# Patient Record
Sex: Male | Born: 1985 | Race: Asian | Hispanic: No | Marital: Single | Smoking: Never smoker
Health system: Southern US, Community
[De-identification: ages and names within clinical notes are randomized; demographics above are authoritative.]

## PROBLEM LIST (undated history)

## (undated) DIAGNOSIS — R6250 Unspecified lack of expected normal physiological development in childhood: Secondary | ICD-10-CM

---

## 2008-11-09 ENCOUNTER — Ambulatory Visit: Payer: Self-pay | Admitting: Internal Medicine

## 2010-12-30 ENCOUNTER — Ambulatory Visit: Payer: Self-pay | Admitting: Internal Medicine

## 2011-03-09 IMAGING — CR RIGHT LITTLE FINGER 2+V
1 series · 3 of 3 positions shown · non-contrast
Comparison: none

REASON FOR EXAM: injured during football; unable to extend distal phalanx
COMMENTS:

[Series 1: view not recorded · 0.17mm/px · 3 of 3 slices shown]
[im 1/3]
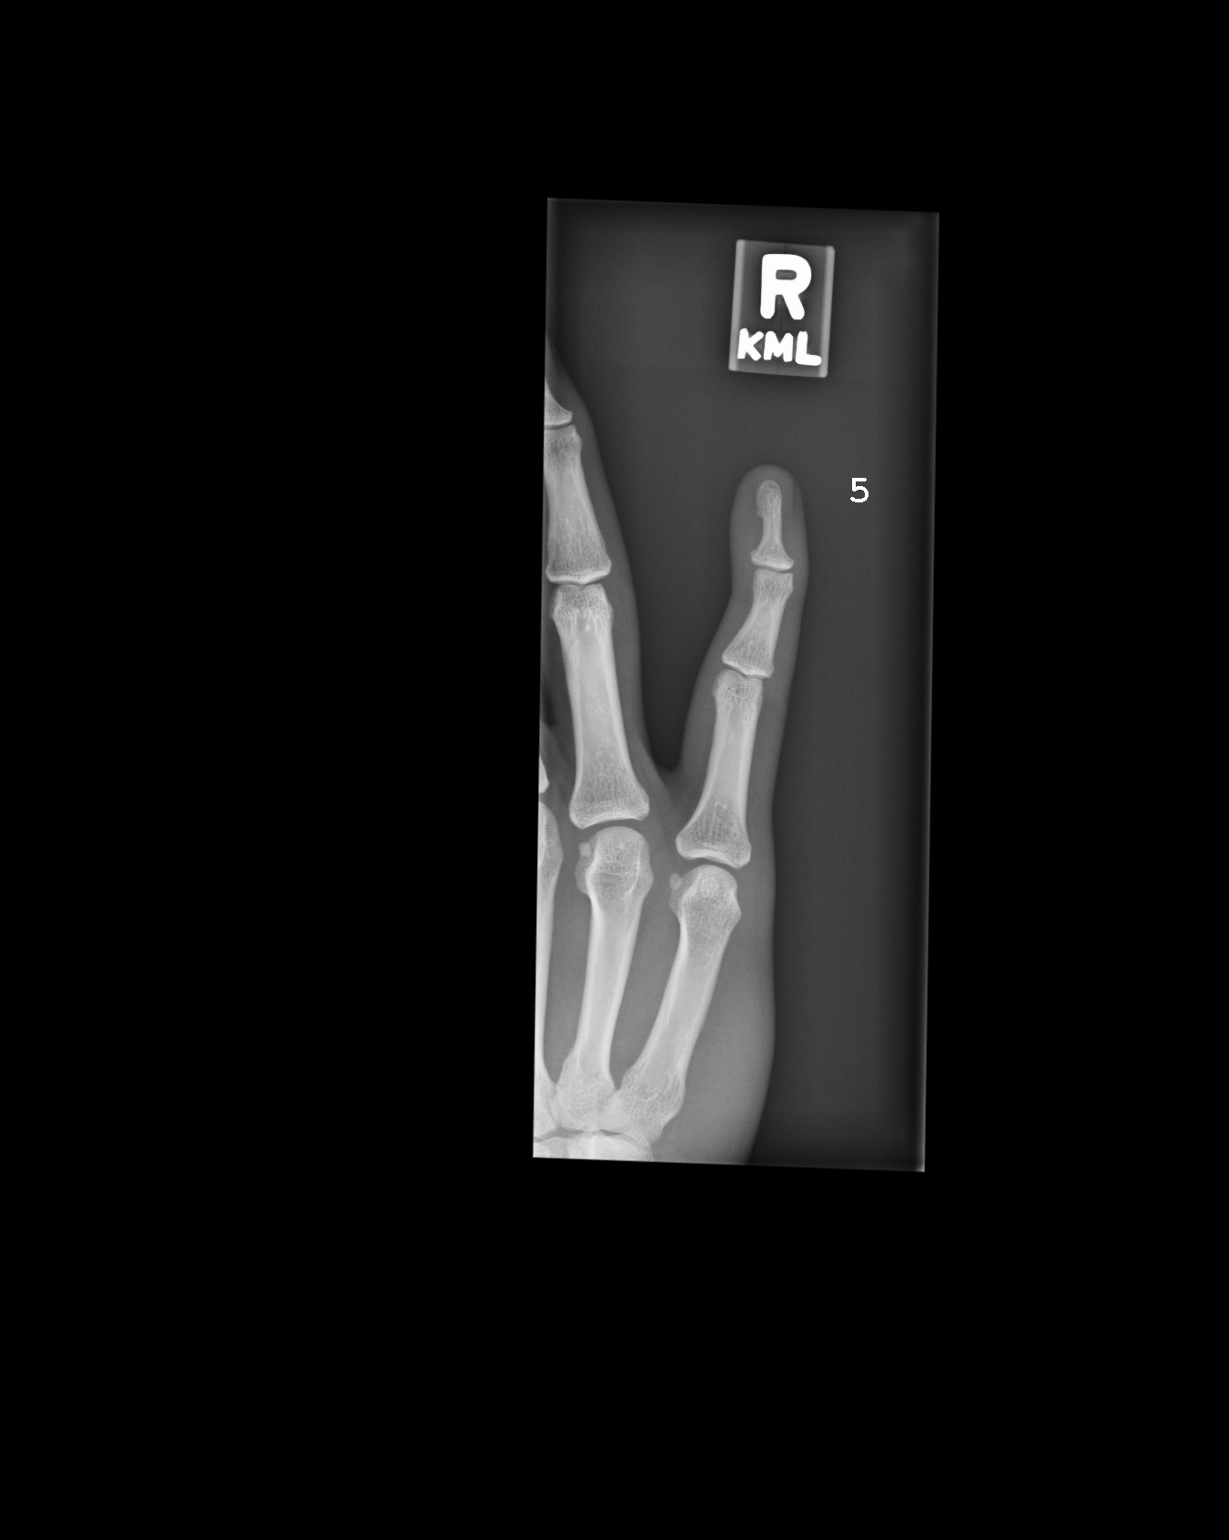
[im 2/3]
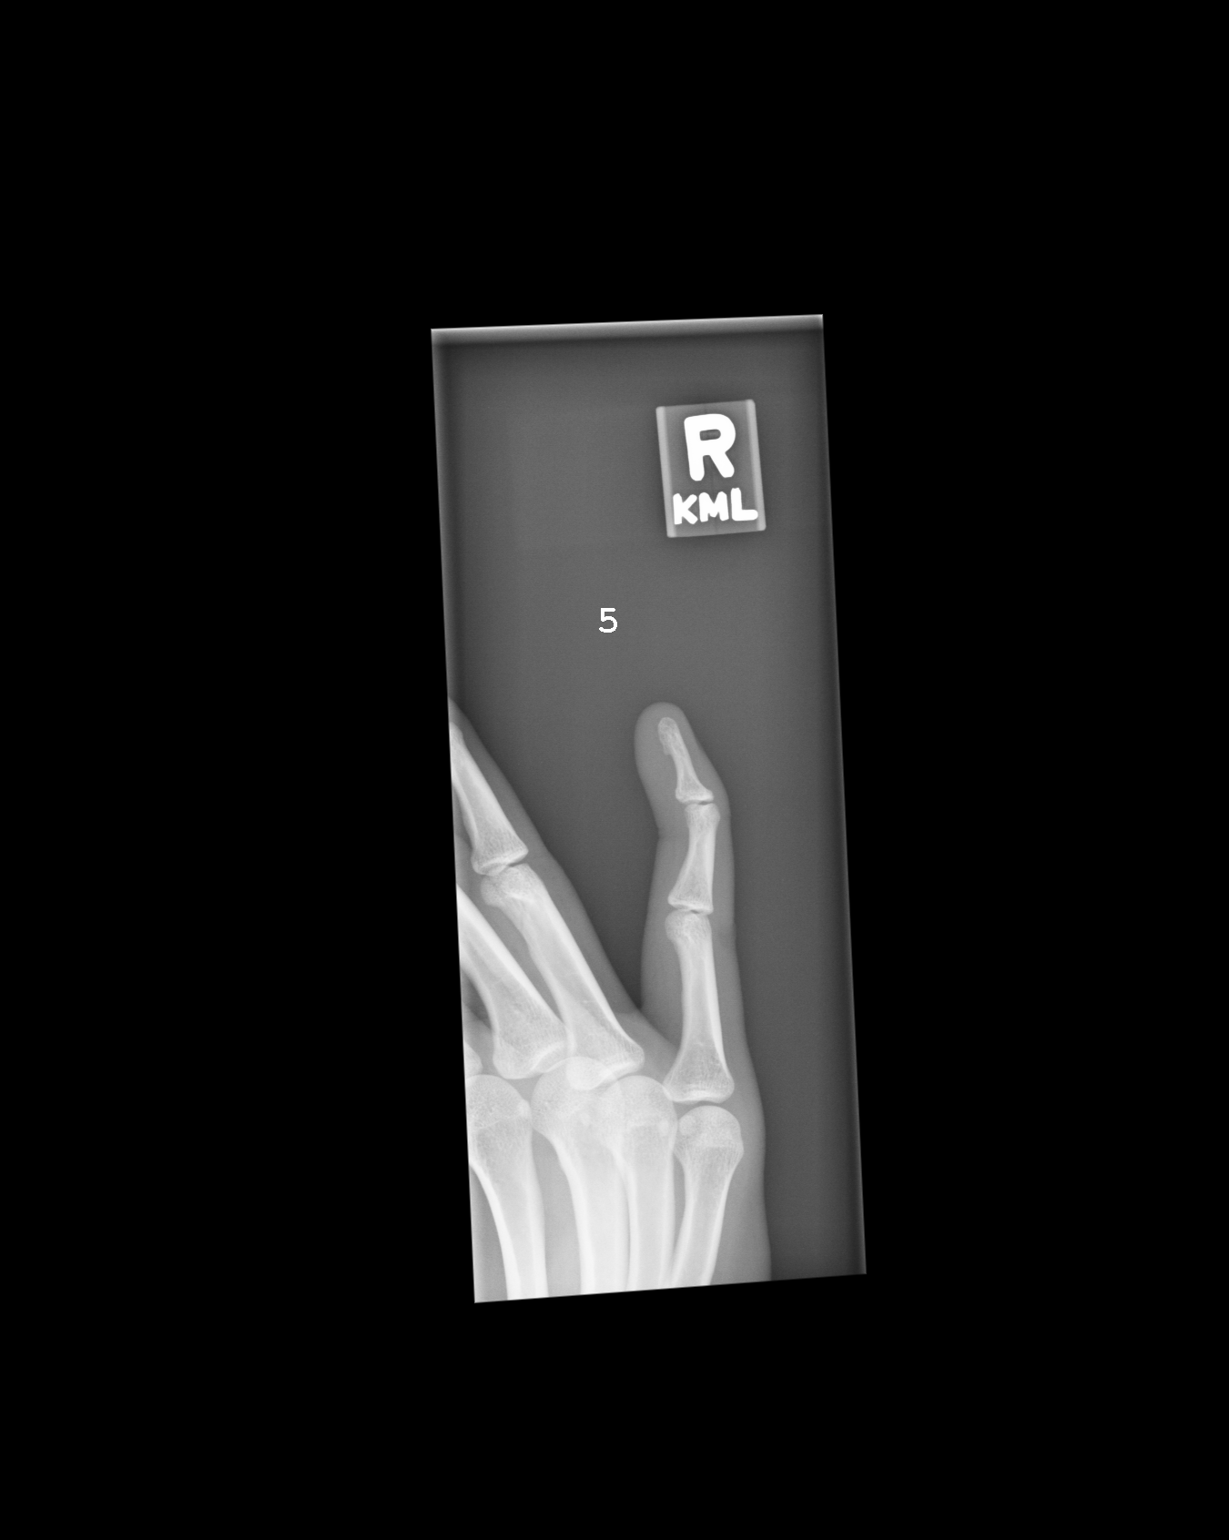
[im 3/3]
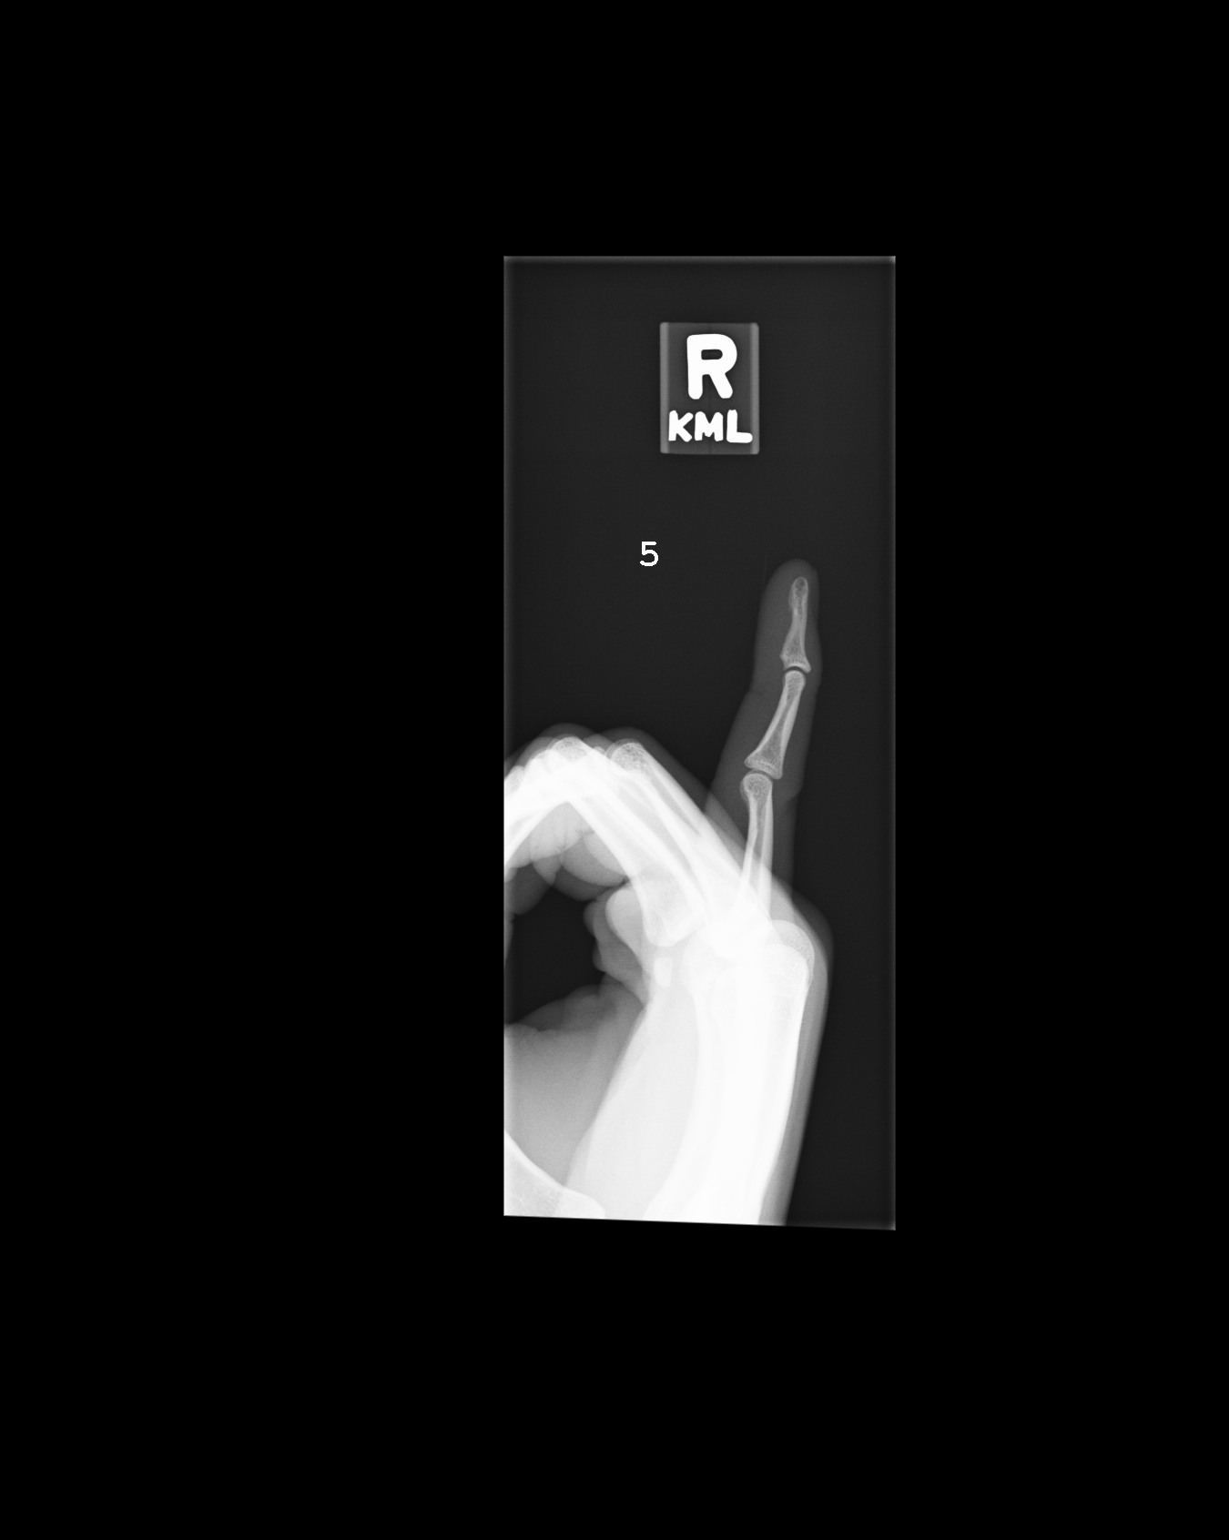

[3 of 3 positions shown; findings below may reference images not displayed]

PROCEDURE:     MDR - MDR FINGER PINK 5TH DIG RT HAND  - November 09, 2008  [DATE]

RESULT:     Three views of the fifth finger reveal the bones to be
adequately mineralized. I do not see evidence of an acute fracture. No
significant degenerative change is seen. There is contour deformity in of
the distal aspect of the middle phalanx and the base of the distal phalanx
that results in angulation at this level of the digit.
IMPRESSION: I do not see evidence of an acute fracture. There may be
developmental or chronic posttraumatic change of the distal aspect of the
fifth digit.

## 2018-06-23 ENCOUNTER — Encounter: Payer: Self-pay | Admitting: Emergency Medicine

## 2018-06-23 ENCOUNTER — Other Ambulatory Visit: Payer: Self-pay

## 2018-06-23 ENCOUNTER — Ambulatory Visit
Admission: EM | Admit: 2018-06-23 | Discharge: 2018-06-23 | Disposition: A | Discharge status: Home / Self Care | Payer: Medicare Other | Attending: Family Medicine | Admitting: Family Medicine

## 2018-06-23 DIAGNOSIS — R21 Rash and other nonspecific skin eruption: Secondary | ICD-10-CM

## 2018-06-23 DIAGNOSIS — L03211 Cellulitis of face: Secondary | ICD-10-CM | POA: Diagnosis not present

## 2018-06-23 HISTORY — DX: Unspecified lack of expected normal physiological development in childhood: R62.50

## 2018-06-23 MED ORDER — VALACYCLOVIR HCL 1 G PO TABS: 1000.0000 mg | ORAL_TABLET | Freq: Three times a day (TID) | ORAL | 0 refills | Status: AC

## 2018-06-23 MED ORDER — AMOXICILLIN 400 MG/5ML PO SUSR: ORAL | 0 refills | Status: DC

## 2018-06-23 NOTE — ED Provider Notes (Signed)
[facility]    CSN: [id number] Arrival date & time: 06/23/18  1528     History   Chief Complaint Chief Complaint  Patient presents with  . Insect Bite    HPI Derrick Webster is a 33 y.o. male.   33 yo male with developmental delay presents with his mom with a concern of a rash on his forehead since yesterday. Patient states rash is painful. Denies any fevers, chills.   The history is provided by a parent.    Past Medical History:  Diagnosis Date  . Developmental delay     There are no active problems to display for this patient.   History reviewed. No pertinent surgical history.     Home Medications    Prior to Admission medications   Medication Sig Start Date End Date Taking? Authorizing Provider  amoxicillin (AMOXIL) 400 MG/5ML suspension 11 ml po bid x 10 days 06/23/18   Payton Mccallum, MD  valACYclovir (VALTREX) 1000 MG tablet Take 1 tablet (1,000 mg total) by mouth 3 (three) times daily. 06/23/18   Payton Mccallum, MD    Family History History reviewed. No pertinent family history.  Social History Social History   Tobacco Use  . Smoking status: Never Smoker  . Smokeless tobacco: Never Used  Substance Use Topics  . Alcohol use: Never    Frequency: Never  . Drug use: Never     Allergies   Patient has no known allergies.   Review of Systems Review of Systems   Physical Exam Triage Vital Signs ED Triage Vitals  Enc Vitals Group     BP 06/23/18 1536 (!) 150/87     Pulse Rate 06/23/18 1536 81     Resp 06/23/18 1536 18     Temp 06/23/18 1536 98.7 F (37.1 C)     Temp Source 06/23/18 1536 Oral     SpO2 06/23/18 1536 100 %     Weight 06/23/18 1537 218 lb (98.9 kg)     Height 06/23/18 1537 5\' 8"  (1.727 m)     Head Circumference --      Peak Flow --      Pain Score 06/23/18 1536 0     Pain Loc --      Pain Edu? --      Excl. in [place]? --    No data found.  Updated Vital Signs BP (!) 150/87 (BP Location: Right Arm)   Pulse 81    Temp 98.7 F (37.1 C) (Oral)   Resp 18   Ht 5\' 8"  (1.727 m)   Wt 98.9 kg   SpO2 100%   BMI 33.15 kg/m   Visual Acuity Right Eye Distance:   Left Eye Distance:   Bilateral Distance:    Right Eye Near:   Left Eye Near:    Bilateral Near:     Physical Exam Vitals signs and nursing note reviewed.  Skin:    Comments: Few blister lesions on foreahead; along with diffuse blanchable erythema, warmth and tenderness to palpationi      UC Treatments / Results  Labs (all labs ordered are listed, but only abnormal results are displayed) Labs Reviewed - No data to display  EKG None  Radiology No results found.  Procedures Procedures (including critical care time)  Medications Ordered in UC Medications - No data to display  Initial Impression / Assessment and Plan / UC Course  I have reviewed the triage vital signs and the nursing notes.  Pertinent labs &  imaging results that were available during my care of the patient were reviewed by me and considered in my medical decision making (see chart for details).      Final Clinical Impressions(s) / UC Diagnoses   Final diagnoses:  Cellulitis, face  Rash of face    ED Prescriptions    Medication Sig Dispense Auth. Provider   amoxicillin (AMOXIL) 400 MG/5ML suspension 11 ml po bid x 10 days 220 mL Henning Ehle, Pamala Hurryrlando, MD   valACYclovir (VALTREX) 1000 MG tablet Take 1 tablet (1,000 mg total) by mouth 3 (three) times daily. 21 tablet Payton Mccallumonty, Hasnain Manheim, MD     1.diagnosis reviewed with patient 2. rx as per orders above; reviewed possible side effects, interactions, risks and benefits   3. Follow-up prn if symptoms worsen or don't improve   Controlled Substance Prescriptions [place] Controlled Substance Registry consulted? Not Applicable   Payton Mccallumonty, Trigg Delarocha, MD 06/23/18 [phone]

## 2018-06-23 NOTE — ED Triage Notes (Signed)
Mother states patient has a rash or bite to his face that started yesterday. Denies itching.

## 2022-03-05 ENCOUNTER — Ambulatory Visit
Admission: EM | Admit: 2022-03-05 | Discharge: 2022-03-05 | Disposition: A | Discharge status: Home / Self Care | Payer: 59 | Attending: Physician Assistant | Admitting: Physician Assistant

## 2022-03-05 DIAGNOSIS — R509 Fever, unspecified: Secondary | ICD-10-CM

## 2022-03-05 DIAGNOSIS — U071 COVID-19: Secondary | ICD-10-CM | POA: Diagnosis present

## 2022-03-05 DIAGNOSIS — R051 Acute cough: Secondary | ICD-10-CM | POA: Diagnosis present

## 2022-03-05 DIAGNOSIS — Z9189 Other specified personal risk factors, not elsewhere classified: Secondary | ICD-10-CM

## 2022-03-05 LAB — RESP PANEL BY RT-PCR (RSV, FLU A&B, COVID)  RVPGX2
Influenza A by PCR: NEGATIVE
Influenza B by PCR: NEGATIVE
Resp Syncytial Virus by PCR: NEGATIVE
SARS Coronavirus 2 by RT PCR: POSITIVE — AB

## 2022-03-05 MED ORDER — PROMETHAZINE-DM 6.25-15 MG/5ML PO SYRP: 5.0000 mL | ORAL_SOLUTION | Freq: Four times a day (QID) | ORAL | 0 refills | Status: AC | PRN

## 2022-03-05 NOTE — ED Triage Notes (Signed)
Pt is with his mother  Pt c/o cough and headache x2days  Pt mother asks for a covid and flu test.  Pt was around his mother who had covid.

## 2022-03-05 NOTE — Discharge Instructions (Addendum)
-  COVID is positive.  He is to isolate 5 days from symptom onset and then wear mask for 5 days. - You have declined antiviral therapy. - Increase rest and fluids.  I have sent cough medication to pharmacy.  Take ibuprofen and Tylenol as needed for fever control. - Return for any uncontrolled fever, weakness or breathing difficulty.

## 2022-03-05 NOTE — ED Provider Notes (Signed)
[facility]    CSN: [id number] Arrival date & time: 03/05/22  1129      History   Chief Complaint No chief complaint on file.   HPI Derrick Webster is a 37 y.o. male with developmental delay.  He presents with his mother today for fever, headaches, cough and congestion.  Denies sore throat, chest pain, shortness of breath, vomiting or diarrhea.  The patient's mother currently sick with COVID.  He lives in household.  Requesting flu and COVID testing.  Has not taken any medicine for symptoms.  No other concerns.  HPI  Past Medical History:  Diagnosis Date   Developmental delay     There are no problems to display for this patient.   History reviewed. No pertinent surgical history.     Home Medications    Prior to Admission medications   Medication Sig Start Date End Date Taking? Authorizing Provider  promethazine-dextromethorphan (PROMETHAZINE-DM) 6.25-15 MG/5ML syrup Take 5 mLs by mouth 4 (four) times daily as needed. 03/05/22  Yes Danton Clap, PA-C  valACYclovir (VALTREX) 1000 MG tablet Take 1 tablet (1,000 mg total) by mouth 3 (three) times daily. 06/23/18   Norval Gable, MD    Family History History reviewed. No pertinent family history.  Social History Social History   Tobacco Use   Smoking status: Never   Smokeless tobacco: Never  Vaping Use   Vaping Use: Never used  Substance Use Topics   Alcohol use: Never   Drug use: Never     Allergies   Patient has no known allergies.   Review of Systems Review of Systems  Constitutional:  Positive for fatigue and fever.  HENT:  Positive for congestion and rhinorrhea. Negative for sinus pressure, sinus pain and sore throat.   Respiratory:  Positive for cough. Negative for shortness of breath.   Gastrointestinal:  Negative for abdominal pain, diarrhea, nausea and vomiting.  Musculoskeletal:  Negative for myalgias.  Neurological:  Negative for weakness, light-headedness and headaches.   Hematological:  Negative for adenopathy.     Physical Exam Triage Vital Signs ED Triage Vitals [03/05/22 1148]  Enc Vitals Group     BP      Pulse      Resp      Temp      Temp src      SpO2      Weight 210 lb (95.3 kg)     Height 5\' 8"  (1.727 m)     Head Circumference      Peak Flow      Pain Score      Pain Loc      Pain Edu?      Excl. in [place]?    No data found.  Updated Vital Signs BP (!) 159/79 (BP Location: Left Arm)   Pulse (!) 104   Temp (!) 101.3 F (38.5 C) (Oral)   Resp 18   Ht 5\' 8"  (1.727 m)   Wt 210 lb (95.3 kg)   SpO2 98%   BMI 31.93 kg/m     Physical Exam Vitals and nursing note reviewed.  Constitutional:      General: He is not in acute distress.    Appearance: Normal appearance. He is well-developed. He is not ill-appearing or diaphoretic.  HENT:     Head: Normocephalic and atraumatic.     Right Ear: Tympanic membrane, ear canal and external ear normal.     Left Ear: Tympanic membrane, ear canal and external ear  normal.     Nose: Congestion present.     Mouth/Throat:     Mouth: Mucous membranes are moist.     Pharynx: Oropharynx is clear. Uvula midline.     Tonsils: No tonsillar abscesses.  Eyes:     General: No scleral icterus.       Right eye: No discharge.        Left eye: No discharge.     Conjunctiva/sclera: Conjunctivae normal.  Neck:     Thyroid: No thyromegaly.     Trachea: No tracheal deviation.  Cardiovascular:     Rate and Rhythm: Regular rhythm. Tachycardia present.     Heart sounds: Normal heart sounds.  Pulmonary:     Effort: Pulmonary effort is normal. No respiratory distress.     Breath sounds: Normal breath sounds. No wheezing, rhonchi or rales.  Musculoskeletal:     Cervical back: Neck supple.  Lymphadenopathy:     Cervical: No cervical adenopathy.  Skin:    General: Skin is warm and dry.     Findings: No rash.  Neurological:     General: No focal deficit present.     Mental Status: He is alert. Mental  status is at baseline.     Motor: No weakness.     Gait: Gait normal.  Psychiatric:        Mood and Affect: Mood normal.        Behavior: Behavior normal.      UC Treatments / Results  Labs (all labs ordered are listed, but only abnormal results are displayed) Labs Reviewed  RESP PANEL BY RT-PCR (RSV, FLU A&B, COVID)  [id number] - Abnormal; Notable for the following components:      Result Value   SARS Coronavirus 2 by RT PCR POSITIVE (*)    All other components within normal limits    EKG   Radiology No results found.  Procedures Procedures (including critical care time)  Medications Ordered in UC Medications - No data to display  Initial Impression / Assessment and Plan / UC Course  I have reviewed the triage vital signs and the nursing notes.  Pertinent labs & imaging results that were available during my care of the patient were reviewed by me and considered in my medical decision making (see chart for details).   37 year old male presents with fever, fatigue, cough, congestion x 2 days.  Known COVID exposure.  Temp 101.3 degrees.  Pulse elevated at 104 bpm.  He is in no acute distress.  Exam is nasal congestion.  Throat is clear.  Chest clear to auscultation.  Respiratory panel obtained.  Positive COVID.  We discussed antiviral medication but patient unable to take pills so declines antiviral therapy.  Reviewed current CDC guidelines, isolation protocol and ED precautions.  Sent Promethazine DM to pharmacy for cough.  Encouraged plenty of rest and fluids.  Reviewed return and ED precautions thoroughly.   Final Clinical Impressions(s) / UC Diagnoses   Final diagnoses:  COVID-19  Fever, unspecified  Acute cough  At increased risk of exposure to COVID-19 virus     Discharge Instructions      -COVID is positive.  He is to isolate 5 days from symptom onset and then wear mask for 5 days. - You have declined antiviral therapy. - Increase rest and fluids.  I have  sent cough medication to pharmacy.  Take ibuprofen and Tylenol as needed for fever control. - Return for any uncontrolled fever, weakness or breathing difficulty.  ED Prescriptions     Medication Sig Dispense Auth. Provider   promethazine-dextromethorphan (PROMETHAZINE-DM) 6.25-15 MG/5ML syrup Take 5 mLs by mouth 4 (four) times daily as needed. 118 mL Danton Clap, PA-C      PDMP not reviewed this encounter.   Danton Clap, PA-C 03/05/22 1237
